# Patient Record
Sex: Female | Born: 2000 | Race: White | Hispanic: Yes | Marital: Single | Smoking: Never smoker
Health system: Southern US, Community
[De-identification: ages and names within clinical notes are randomized; demographics above are authoritative.]

## PROBLEM LIST (undated history)

## (undated) HISTORY — PX: INCISION AND DRAINAGE DEEP NECK ABSCESS: SHX1797

---
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
## 2018-01-04 ENCOUNTER — Ambulatory Visit (INDEPENDENT_AMBULATORY_CARE_PROVIDER_SITE_OTHER): Payer: Medicaid Other | Admitting: Pediatric Gastroenterology

## 2018-01-11 ENCOUNTER — Encounter (INDEPENDENT_AMBULATORY_CARE_PROVIDER_SITE_OTHER): Payer: Self-pay | Admitting: Pediatric Gastroenterology

## 2022-05-12 DIAGNOSIS — R5383 Other fatigue: Secondary | ICD-10-CM | POA: Diagnosis not present

## 2022-05-12 DIAGNOSIS — R7989 Other specified abnormal findings of blood chemistry: Secondary | ICD-10-CM | POA: Diagnosis not present

## 2022-07-04 DIAGNOSIS — Z3202 Encounter for pregnancy test, result negative: Secondary | ICD-10-CM | POA: Diagnosis not present

## 2022-07-04 DIAGNOSIS — N10 Acute tubulo-interstitial nephritis: Secondary | ICD-10-CM | POA: Diagnosis not present

## 2022-07-08 DIAGNOSIS — R300 Dysuria: Secondary | ICD-10-CM | POA: Diagnosis not present

## 2022-07-08 DIAGNOSIS — Z113 Encounter for screening for infections with a predominantly sexual mode of transmission: Secondary | ICD-10-CM | POA: Diagnosis not present

## 2022-07-08 DIAGNOSIS — R109 Unspecified abdominal pain: Secondary | ICD-10-CM | POA: Diagnosis not present

## 2022-07-08 DIAGNOSIS — R3129 Other microscopic hematuria: Secondary | ICD-10-CM | POA: Diagnosis not present

## 2022-07-09 ENCOUNTER — Other Ambulatory Visit: Payer: Self-pay | Admitting: Nurse Practitioner

## 2022-07-09 ENCOUNTER — Ambulatory Visit
Admission: RE | Admit: 2022-07-09 | Discharge: 2022-07-09 | Disposition: A | Discharge status: Home / Self Care | Payer: BC Managed Care – PPO | Source: Ambulatory Visit | Attending: Nurse Practitioner | Admitting: Nurse Practitioner

## 2022-07-09 DIAGNOSIS — R3129 Other microscopic hematuria: Secondary | ICD-10-CM

## 2022-07-09 DIAGNOSIS — R319 Hematuria, unspecified: Secondary | ICD-10-CM | POA: Diagnosis not present

## 2022-07-09 DIAGNOSIS — R109 Unspecified abdominal pain: Secondary | ICD-10-CM

## 2022-07-09 DIAGNOSIS — R300 Dysuria: Secondary | ICD-10-CM

## 2022-07-30 DIAGNOSIS — H16403 Unspecified corneal neovascularization, bilateral: Secondary | ICD-10-CM | POA: Diagnosis not present

## 2022-07-30 DIAGNOSIS — H5213 Myopia, bilateral: Secondary | ICD-10-CM | POA: Diagnosis not present

## 2022-07-30 DIAGNOSIS — H1789 Other corneal scars and opacities: Secondary | ICD-10-CM | POA: Diagnosis not present

## 2022-07-30 DIAGNOSIS — H04123 Dry eye syndrome of bilateral lacrimal glands: Secondary | ICD-10-CM | POA: Diagnosis not present

## 2022-09-01 DIAGNOSIS — R319 Hematuria, unspecified: Secondary | ICD-10-CM | POA: Diagnosis not present

## 2022-09-23 DIAGNOSIS — Z7251 High risk heterosexual behavior: Secondary | ICD-10-CM | POA: Diagnosis not present

## 2022-09-23 DIAGNOSIS — Z9189 Other specified personal risk factors, not elsewhere classified: Secondary | ICD-10-CM | POA: Diagnosis not present

## 2022-09-23 DIAGNOSIS — Z01419 Encounter for gynecological examination (general) (routine) without abnormal findings: Secondary | ICD-10-CM | POA: Diagnosis not present

## 2022-12-12 DIAGNOSIS — R319 Hematuria, unspecified: Secondary | ICD-10-CM | POA: Diagnosis not present

## 2022-12-12 DIAGNOSIS — N898 Other specified noninflammatory disorders of vagina: Secondary | ICD-10-CM | POA: Diagnosis not present

## 2023-05-04 ENCOUNTER — Emergency Department (HOSPITAL_BASED_OUTPATIENT_CLINIC_OR_DEPARTMENT_OTHER)
Admission: EM | Admit: 2023-05-04 | Discharge: 2023-05-04 | Disposition: A | Discharge status: Home / Self Care | Payer: BC Managed Care – PPO | Attending: Emergency Medicine | Admitting: Emergency Medicine

## 2023-05-04 ENCOUNTER — Encounter (HOSPITAL_BASED_OUTPATIENT_CLINIC_OR_DEPARTMENT_OTHER): Payer: Self-pay | Admitting: Emergency Medicine

## 2023-05-04 ENCOUNTER — Other Ambulatory Visit: Payer: Self-pay

## 2023-05-04 DIAGNOSIS — H66001 Acute suppurative otitis media without spontaneous rupture of ear drum, right ear: Secondary | ICD-10-CM | POA: Diagnosis not present

## 2023-05-04 DIAGNOSIS — H9201 Otalgia, right ear: Secondary | ICD-10-CM | POA: Diagnosis not present

## 2023-05-04 MED ORDER — NAPROXEN 375 MG PO TABS: ORAL_TABLET | ORAL | 0 refills | Status: AC

## 2023-05-04 MED ORDER — AMOXICILLIN 500 MG PO CAPS: 1000.0000 mg | ORAL_CAPSULE | Freq: Three times a day (TID) | ORAL | 0 refills | Status: AC

## 2023-05-04 MED ORDER — NAPROXEN 250 MG PO TABS
500.0000 mg | ORAL_TABLET | Freq: Once | ORAL | Status: AC
  Administered 2023-05-04: 500 mg via ORAL
  Filled 2023-05-04: qty 2

## 2023-05-04 MED ORDER — FLUCONAZOLE 150 MG PO TABS: ORAL_TABLET | ORAL | 0 refills | Status: AC

## 2023-05-04 MED ORDER — AMOXICILLIN 500 MG PO CAPS
1000.0000 mg | ORAL_CAPSULE | Freq: Once | ORAL | Status: AC
  Administered 2023-05-04: 1000 mg via ORAL
  Filled 2023-05-04: qty 2

## 2023-05-04 NOTE — ED Triage Notes (Signed)
Pt states she was recently in [place]. She had body aches and was injected with a medication by a doctor there on Friday. Today she woke up with R ear pain and muffled hearing and R sided facial pain. She states it feels as though something is in her ear. Denies fever.

## 2023-05-04 NOTE — ED Provider Notes (Signed)
[facility] Provider Note: Lowella Dell, MD, FACEP  CSN: [id number] MRN: [id number] ARRIVAL: 05/04/23 at 0019 ROOM: [facility]   CHIEF COMPLAINT  Ear Pain   HISTORY OF PRESENT ILLNESS  05/04/23 12:32 AM Shelley Mays is a 22 y.o. female who was recently in [place].  3 days ago she had bodyaches and was injected with metamizole, an NSAID like drug use as a painkiller, antispasmodic and fever reducer.  It is only approved for equine veterinary use in the [place].  She also had some eye irritation and was started on drops containing chloramphenicol, naphazoline and dexamethasone.  Yesterday morning she awakened with pain in her right ear and a sensation of something being in her right ear with muffled hearing.  She rates the pain as a 6 out of 10.  The pain radiates to the right side of her face.  She is not aware of having a fever with this.    History reviewed. No pertinent past medical history.  Past Surgical History:  Procedure Laterality Date   INCISION AND DRAINAGE DEEP NECK ABSCESS      Family History  Problem Relation Age of Onset   Hypertension Maternal Grandmother    Diabetes Paternal Grandmother     Social History   Tobacco Use   Smoking status: Never   Smokeless tobacco: Never  Vaping Use   Vaping Use: Never used  Substance Use Topics   Alcohol use: No   Drug use: No    Prior to Admission medications   Medication Sig Start Date End Date Taking? Authorizing Provider  amoxicillin (AMOXIL) 500 MG capsule Take 2 capsules (1,000 mg total) by mouth 3 (three) times daily for 7 days. 05/04/23 05/11/23 Yes Kaleigha Chamberlin, MD  fluconazole (DIFLUCAN) 150 MG tablet Take 1 tablet as needed for vaginal yeast infection.  May repeat in 3 days if symptoms persist. 05/04/23  Yes Nickey Canedo, MD  naproxen (NAPROSYN) 375 MG tablet Take 1 tablet twice daily as needed for ear pain. 05/04/23  Yes Ibraheem Voris, MD    Allergies Patient has no known  allergies.   REVIEW OF SYSTEMS  Negative except as noted here or in the History of Present Illness.   PHYSICAL EXAMINATION  Initial Vital Signs Blood pressure 121/79, pulse 82, temperature 98 F (36.7 C), temperature source Oral, resp. rate 20, height 5' (1.524 m), weight 61.7 kg, last menstrual period 04/13/2023, SpO2 99 %.  Examination General: Well-developed, well-nourished female in no acute distress; appearance consistent with age of record HENT: normocephalic; atraumatic; left TM normal, right TM bulging and erythematous, no pain on movement of external ear Eyes: Normal appearance Neck: supple Heart: regular rate and rhythm Lungs: clear to auscultation bilaterally Abdomen: soft; nondistended; nontender; bowel sounds present Extremities: No deformity; full range of motion; pulses normal Neurologic: Awake, alert and oriented; motor function intact in all extremities and symmetric; no facial droop Skin: Warm and dry Psychiatric: Normal mood and affect   RESULTS  Summary of this visit's results, reviewed and interpreted by myself:   EKG Interpretation  Date/Time:    Ventricular Rate:    PR Interval:    QRS Duration:   QT Interval:    QTC Calculation:   R Axis:     Text Interpretation:         Laboratory Studies: No results found for this or any previous visit (from the past 24 hour(s)). Imaging Studies: No results found.  ED COURSE and MDM  Nursing notes,  initial and subsequent vitals signs, including pulse oximetry, reviewed and interpreted by myself.  Vitals:   05/04/23 0027 05/04/23 0031  BP:  121/79  Pulse:  82  Resp:  20  Temp:  98 F (36.7 C)  TempSrc:  Oral  SpO2:  99%  Weight: 61.7 kg   Height: 5' (1.524 m)    Medications  amoxicillin (AMOXIL) capsule 1,000 mg (has no administration in time range)  naproxen (NAPROSYN) tablet 500 mg (has no administration in time range)    Presentation is consistent with acute right otitis media.  She flew  to and from [place] and changes in air pressure may have triggered or exacerbated this.  We will place her on amoxicillin.  No facial swelling, tenderness or erythema to suggest cellulitis or other extension beyond the middle ear at this time.  PROCEDURES  Procedures   ED DIAGNOSES     ICD-10-CM   1. Non-recurrent acute suppurative otitis media of right ear without spontaneous rupture of tympanic membrane  H66.001          Alfie Alderfer, MD 05/04/23 [phone]

## 2023-05-29 DIAGNOSIS — Z7251 High risk heterosexual behavior: Secondary | ICD-10-CM | POA: Diagnosis not present

## 2023-05-29 DIAGNOSIS — R102 Pelvic and perineal pain: Secondary | ICD-10-CM | POA: Diagnosis not present

## 2023-05-29 DIAGNOSIS — N898 Other specified noninflammatory disorders of vagina: Secondary | ICD-10-CM | POA: Diagnosis not present

## 2023-07-08 DIAGNOSIS — M9902 Segmental and somatic dysfunction of thoracic region: Secondary | ICD-10-CM | POA: Diagnosis not present

## 2023-07-08 DIAGNOSIS — M9903 Segmental and somatic dysfunction of lumbar region: Secondary | ICD-10-CM | POA: Diagnosis not present

## 2023-07-08 DIAGNOSIS — M5386 Other specified dorsopathies, lumbar region: Secondary | ICD-10-CM | POA: Diagnosis not present

## 2023-07-08 DIAGNOSIS — M9904 Segmental and somatic dysfunction of sacral region: Secondary | ICD-10-CM | POA: Diagnosis not present

## 2023-07-21 DIAGNOSIS — M9904 Segmental and somatic dysfunction of sacral region: Secondary | ICD-10-CM | POA: Diagnosis not present

## 2023-07-21 DIAGNOSIS — M9903 Segmental and somatic dysfunction of lumbar region: Secondary | ICD-10-CM | POA: Diagnosis not present

## 2023-07-21 DIAGNOSIS — M9902 Segmental and somatic dysfunction of thoracic region: Secondary | ICD-10-CM | POA: Diagnosis not present

## 2023-07-21 DIAGNOSIS — M5386 Other specified dorsopathies, lumbar region: Secondary | ICD-10-CM | POA: Diagnosis not present

## 2023-08-04 DIAGNOSIS — H16403 Unspecified corneal neovascularization, bilateral: Secondary | ICD-10-CM | POA: Diagnosis not present

## 2023-08-04 DIAGNOSIS — H5213 Myopia, bilateral: Secondary | ICD-10-CM | POA: Diagnosis not present

## 2023-09-10 DIAGNOSIS — Z111 Encounter for screening for respiratory tuberculosis: Secondary | ICD-10-CM | POA: Diagnosis not present

## 2023-09-13 DIAGNOSIS — Z111 Encounter for screening for respiratory tuberculosis: Secondary | ICD-10-CM | POA: Diagnosis not present

## 2023-10-07 DIAGNOSIS — M9902 Segmental and somatic dysfunction of thoracic region: Secondary | ICD-10-CM | POA: Diagnosis not present

## 2023-10-07 DIAGNOSIS — M9904 Segmental and somatic dysfunction of sacral region: Secondary | ICD-10-CM | POA: Diagnosis not present

## 2023-10-07 DIAGNOSIS — M5386 Other specified dorsopathies, lumbar region: Secondary | ICD-10-CM | POA: Diagnosis not present

## 2023-10-07 DIAGNOSIS — M9903 Segmental and somatic dysfunction of lumbar region: Secondary | ICD-10-CM | POA: Diagnosis not present

## 2023-10-12 DIAGNOSIS — Z01419 Encounter for gynecological examination (general) (routine) without abnormal findings: Secondary | ICD-10-CM | POA: Diagnosis not present

## 2023-10-12 DIAGNOSIS — Z113 Encounter for screening for infections with a predominantly sexual mode of transmission: Secondary | ICD-10-CM | POA: Diagnosis not present
